# Patient Record
Sex: Female | Born: 1980 | Race: Black or African American | Hispanic: No | Marital: Single | Smoking: Former smoker
Health system: Southern US, Community
[De-identification: ages and names within clinical notes are randomized; demographics above are authoritative.]

## PROBLEM LIST (undated history)

## (undated) HISTORY — PX: CHOLECYSTECTOMY: SHX55

## (undated) HISTORY — PX: OTHER SURGICAL HISTORY: SHX169

---

## 2004-03-07 ENCOUNTER — Ambulatory Visit: Payer: Self-pay | Admitting: Unknown Physician Specialty

## 2004-07-13 ENCOUNTER — Inpatient Hospital Stay: Payer: Self-pay

## 2005-07-22 ENCOUNTER — Inpatient Hospital Stay: Payer: Self-pay | Admitting: Obstetrics and Gynecology

## 2006-10-15 ENCOUNTER — Emergency Department: Payer: Self-pay

## 2006-10-17 ENCOUNTER — Ambulatory Visit: Payer: Self-pay

## 2006-11-06 ENCOUNTER — Ambulatory Visit: Payer: Self-pay | Admitting: General Surgery

## 2007-11-26 ENCOUNTER — Emergency Department: Payer: Self-pay | Admitting: Emergency Medicine

## 2009-04-01 ENCOUNTER — Emergency Department: Payer: Self-pay | Admitting: Emergency Medicine

## 2009-06-17 ENCOUNTER — Emergency Department: Payer: Self-pay | Admitting: Emergency Medicine

## 2009-07-05 ENCOUNTER — Emergency Department: Payer: Self-pay | Admitting: Emergency Medicine

## 2009-07-08 ENCOUNTER — Emergency Department: Payer: Self-pay | Admitting: Emergency Medicine

## 2009-07-25 ENCOUNTER — Emergency Department: Payer: Self-pay | Admitting: Emergency Medicine

## 2009-12-10 ENCOUNTER — Emergency Department: Payer: Self-pay | Admitting: Emergency Medicine

## 2010-01-04 ENCOUNTER — Emergency Department: Payer: Self-pay | Admitting: Unknown Physician Specialty

## 2010-02-21 ENCOUNTER — Emergency Department: Payer: Self-pay | Admitting: Emergency Medicine

## 2010-06-10 ENCOUNTER — Emergency Department: Payer: Self-pay | Admitting: Unknown Physician Specialty
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
## 2013-01-04 ENCOUNTER — Emergency Department: Payer: Self-pay | Admitting: Psychiatry

## 2013-01-04 LAB — COMPREHENSIVE METABOLIC PANEL
Albumin: 3.8 g/dL (ref 3.4–5.0)
Alkaline Phosphatase: 95 U/L (ref 50–136)
Anion Gap: 5 — ABNORMAL LOW (ref 7–16)
Bilirubin,Total: 0.5 mg/dL (ref 0.2–1.0)
Chloride: 107 mmol/L (ref 98–107)
Co2: 25 mmol/L (ref 21–32)
Creatinine: 0.79 mg/dL (ref 0.60–1.30)
EGFR (African American): >60
Glucose: 113 mg/dL — ABNORMAL HIGH (ref 65–99)
SGPT (ALT): 22 U/L (ref 12–78)
Sodium: 137 mmol/L (ref 136–145)
Total Protein: 7.5 g/dL (ref 6.4–8.2)

## 2013-01-04 LAB — LIPASE, BLOOD: Lipase: 132 U/L (ref 73–393)

## 2013-01-04 LAB — CBC
HCT: 40.2 % (ref 35.0–47.0)
MCH: 29.2 pg (ref 26.0–34.0)
MCV: 84 fL (ref 80–100)
Platelet: 206 10*3/uL (ref 150–440)
RDW: 13.2 % (ref 11.5–14.5)

## 2013-01-04 LAB — URINALYSIS, COMPLETE
Bilirubin,UR: NEGATIVE
Blood: NEGATIVE
Ketone: NEGATIVE
Nitrite: NEGATIVE
Squamous Epithelial: 7
WBC UR: 15 /HPF (ref 0–5)

## 2013-01-06 LAB — URINE CULTURE

## 2013-11-27 ENCOUNTER — Ambulatory Visit: Payer: Self-pay | Admitting: Obstetrics and Gynecology

## 2013-11-27 LAB — CBC
HCT: 39 % (ref 35.0–47.0)
HGB: 12.7 g/dL (ref 12.0–16.0)
MCH: 28.1 pg (ref 26.0–34.0)
MCHC: 32.6 g/dL (ref 32.0–36.0)
MCV: 86 fL (ref 80–100)
Platelet: 196 10*3/uL (ref 150–440)
RBC: 4.52 10*6/uL (ref 3.80–5.20)
RDW: 13 % (ref 11.5–14.5)
WBC: 7.8 10*3/uL (ref 3.6–11.0)

## 2013-11-27 LAB — BASIC METABOLIC PANEL
Anion Gap: 5 — ABNORMAL LOW (ref 7–16)
BUN: 10 mg/dL (ref 7–18)
CALCIUM: 8.6 mg/dL (ref 8.5–10.1)
CHLORIDE: 107 mmol/L (ref 98–107)
CREATININE: 0.82 mg/dL (ref 0.60–1.30)
Co2: 25 mmol/L (ref 21–32)
EGFR (African American): >60
EGFR (Non-African Amer.): >60
GLUCOSE: 93 mg/dL (ref 65–99)
Osmolality: 273 (ref 275–301)
POTASSIUM: 4.4 mmol/L (ref 3.5–5.1)
Sodium: 137 mmol/L (ref 136–145)

## 2013-12-05 ENCOUNTER — Ambulatory Visit: Payer: Self-pay | Admitting: Obstetrics and Gynecology

## 2013-12-09 LAB — PATHOLOGY REPORT

## 2014-04-19 ENCOUNTER — Emergency Department: Payer: Self-pay | Admitting: Emergency Medicine

## 2014-04-22 LAB — BETA STREP CULTURE(ARMC)

## 2014-09-12 NOTE — Op Note (Signed)
PATIENT NAME:  Deborah RumpfHORNTON, Caresse R MR#:  [id number] DATE OF BIRTH:  27-Jun-1980  DATE OF PROCEDURE:  12/05/2013  PREOPERATIVE DIAGNOSIS: Multiparous female desiring permanent sterilization.   POSTOPERATIVE DIAGNOSIS:  Multiparous female desiring permanent sterilization.   PROCEDURE:  Bilateral salpingectomy.   SURGEON: Elliot Gurneyarrie C Kandise Riehle, M.D.   ESTIMATED BLOOD LOSS: Less than 50 mL.  FINDINGS: Bilateral tubes with fimbria. Normal ovaries bilaterally. (Dictation Anomaly) normal yet anemic appearing uterus.   PROCEDURE IN DETAIL:  The patient was taken to the operating room and placed in the supine position. After adequate general endotracheal anesthesia was instilled, the patient was prepped and draped in the usual sterile fashion. A side-opening speculum was placed in the patient's vagina and the anterior lip of the cervix was grasped with a single-tooth tenaculum.  was placed and the speculum was removed. The patient's bladder was emptied, with a red rubber which remained in place until the end of the procedure.   Attention was then turned to the abdomen where timeout was performed and the umbilicus was injected with Marcaine. An incision was made through the umbilicus. Veress needle was placed. Hanging drop test,  fluid instillation test, and fluid aspiration test showed proper placement of the Veress needle. The patient was placed in Trendelenburg. CO2 was placed on low flow. When tympany was heard around the liver, CO2 was placed on high flow. The Veress needle was removed and the Xcel trocar was placed under direct visualization with the camera. A 5 mm trocar port was placed on the left lower edge of the left lower quadrant and the 11 mm trocar port was placed in the midline above the pubic symphysis.   The uterus and tubes were doused with Marcaine.  The right tube was grasped and the Harmonic scalpel was used to cut across the uterotubal broad ligament, then used to cut across the tube  cauterizing this at the point of detachment of the right tube. In a similar fashion, the left tube was grasped.  The broad ligament was cut through with the Harmonic scalpel. The Harmonic scalpel was then used to cauterize and cut across the tubes approximately 2 cm past the cornua. Good hemostasis was identified. There was one area that appeared to be a duplication of the tube on the patient's right side and this area was also excised and sent to pathology. The pelvis was copiously irrigated with warm normal saline. Trocars were removed. The patient was taken out of Trendelenburg. CO2 was allowed to escape from the abdomen. The Hulka tenaculum was removed from the cervix. The red rubber was removed.  Monocryl 4-0 was used to approximate the skin edges. Dermabond was placed, and 2 x 2's and Tegaderms to cover the incision. The patient was laid supine and taken to recovery after having tolerated the procedure well.     ____________________________ Elliot Gurneyarrie C. Jan Olano, MD cck:ds D: 12/10/2013 17:57:01 ET T: 12/10/2013 20:02:59 ET JOB#: [id number]  cc: Elliot Gurneyarrie C. Ellen Mayol, MD, <Dictator> Elliot GurneyARRIE C Deavin Forst MD ELECTRONICALLY SIGNED 12/11/2013 9:16

## 2018-03-11 ENCOUNTER — Encounter: Payer: Self-pay | Admitting: Emergency Medicine

## 2018-03-11 ENCOUNTER — Other Ambulatory Visit: Payer: Self-pay

## 2018-03-11 ENCOUNTER — Ambulatory Visit
Admission: EM | Admit: 2018-03-11 | Discharge: 2018-03-11 | Disposition: A | Discharge status: Home / Self Care | Payer: Self-pay | Attending: Internal Medicine | Admitting: Internal Medicine

## 2018-03-11 DIAGNOSIS — A491 Streptococcal infection, unspecified site: Secondary | ICD-10-CM | POA: Insufficient documentation

## 2018-03-11 DIAGNOSIS — Z20818 Contact with and (suspected) exposure to other bacterial communicable diseases: Secondary | ICD-10-CM | POA: Insufficient documentation

## 2018-03-11 DIAGNOSIS — J029 Acute pharyngitis, unspecified: Secondary | ICD-10-CM | POA: Insufficient documentation

## 2018-03-11 DIAGNOSIS — Z792 Long term (current) use of antibiotics: Secondary | ICD-10-CM | POA: Insufficient documentation

## 2018-03-11 DIAGNOSIS — Z9049 Acquired absence of other specified parts of digestive tract: Secondary | ICD-10-CM | POA: Insufficient documentation

## 2018-03-11 DIAGNOSIS — Z9889 Other specified postprocedural states: Secondary | ICD-10-CM | POA: Insufficient documentation

## 2018-03-11 DIAGNOSIS — F17210 Nicotine dependence, cigarettes, uncomplicated: Secondary | ICD-10-CM | POA: Insufficient documentation

## 2018-03-11 LAB — RAPID STREP SCREEN (MED CTR MEBANE ONLY): STREPTOCOCCUS, GROUP A SCREEN (DIRECT): NEGATIVE

## 2018-03-11 MED ORDER — PENICILLIN V POTASSIUM 500 MG PO TABS: ORAL_TABLET | ORAL | 0 refills | Status: AC

## 2018-03-11 NOTE — ED Provider Notes (Addendum)
[facility]    CSN: [id number] Arrival date & time: 03/11/18  1204     History   Chief Complaint Chief Complaint  Patient presents with  . Generalized Body Aches  . Sore Throat    HPI Deborah Valdez is a 37 y.o. female.   Onset of body aches, ST, swollen glands, headache and L ear pain since this am. Was fine when she went to bed. Tried to go to work this am and had to leave. Had been having sweating today and last night. Has not taken any meds so far. Her daughter was diagnosed with strep test last week.      History reviewed. No pertinent past medical history.  There are no active problems to display for this patient.   Past Surgical History:  Procedure Laterality Date  . CHOLECYSTECTOMY    . fallopian tubes removed      OB History   None      Home Medications    Prior to Admission medications   Medication Sig Start Date End Date Taking? Authorizing Provider  penicillin v potassium (VEETID) 500 MG tablet One bid x 10 days 03/11/18   Rodriguez-Southworth, Nettie Elm, PA-C    Family History Family History  Problem Relation Age of Onset  . Healthy Mother   . Healthy Father     Social History Social History   Tobacco Use  . Smoking status: Current Every Day Smoker    Packs/day: 0.50    Years: 12.00    Pack years: 6.00    Types: Cigarettes  . Smokeless tobacco: Never Used  Substance Use Topics  . Alcohol use: Yes    Comment: socially  . Drug use: Never     Allergies   Patient has no known allergies.   Review of Systems Review of Systems  Constitutional: Positive for appetite change, chills, fatigue and fever.  HENT: Positive for congestion, postnasal drip, rhinorrhea and sore throat. Negative for ear discharge, ear pain, mouth sores, trouble swallowing and voice change.   Eyes: Negative for discharge.  Respiratory: Positive for cough. Negative for chest tightness and shortness of breath.   Gastrointestinal: Negative for  nausea and vomiting.  Musculoskeletal: Negative for gait problem.  Skin: Negative for rash.  Neurological: Positive for headaches. Negative for dizziness.  Hematological: Positive for adenopathy.   Physical Exam Triage Vital Signs ED Triage Vitals  Enc Vitals Group     BP 03/11/18 1221 110/70     Pulse Rate 03/11/18 1221 72     Resp 03/11/18 1221 16     Temp 03/11/18 1221 98.2 F (36.8 C)     Temp Source 03/11/18 1221 Oral     SpO2 03/11/18 1221 99 %     Weight 03/11/18 1220 246 lb (111.6 kg)     Height 03/11/18 1220 5\' 8"  (1.727 m)     Head Circumference --      Peak Flow --      Pain Score 03/11/18 1220 7     Pain Loc --      Pain Edu? --      Excl. in GC? --    No data found.  Updated Vital Signs BP 110/70 (BP Location: Left Arm)   Pulse 72   Temp 98.2 F (36.8 C) (Oral)   Resp 16   Ht 5\' 8"  (1.727 m)   Wt 246 lb (111.6 kg)   LMP 02/23/2018 (Exact Date)   SpO2 99%   BMI 37.40 kg/m  Visual Acuity Right Eye Distance:   Left Eye Distance:   Bilateral Distance:    Right Eye Near:   Left Eye Near:    Bilateral Near:     Physical Exam  Constitutional: She is oriented to person, place, and time. She appears well-nourished.  Non-toxic appearance. She does not appear ill. No distress.  HENT:  Right Ear: Hearing, tympanic membrane and ear canal normal. No drainage, swelling or tenderness.  Left Ear: Hearing, tympanic membrane and ear canal normal. No drainage, swelling or tenderness.  Mouth/Throat: Mucous membranes are normal. No oral lesions. No uvula swelling. No oropharyngeal exudate, posterior oropharyngeal edema, posterior oropharyngeal erythema or tonsillar abscesses.  Pharynx slightly erythematous  Eyes: EOM are normal.  Neck: Neck supple.  Upper anterior cervical  Cardiovascular: Normal rate and normal heart sounds.  No murmur heard. Pulmonary/Chest: Effort normal and breath sounds normal.  Lymphadenopathy:    She has cervical adenopathy.    Neurological: She is alert and oriented to person, place, and time.  Skin: Skin is warm and dry. No rash noted.  Psychiatric: She has a normal mood and affect. Her behavior is normal.  Vitals reviewed.    UC Treatments / Results  Labs (all labs ordered are listed, but only abnormal results are displayed) Labs Reviewed  RAPID STREP SCREEN ([facility] ONLY)  CULTURE, GROUP A STREP [facility])    EKG None  Radiology No results found.  Procedures none  Medications Ordered in UC Medications - No data to display  Initial Impression / Assessment and Plan / UC Course  I have reviewed the triage vital signs and the nursing notes.  Pertinent labs  Results that were available during my care of the patient were reviewed by me and considered in my medical decision making (see chart for details). Due to being exposed to strep, I went ahead and stared her on [facility] as noted and we will call her with results when done.  She is to watch out for influenza symptoms, specially if culture is neg.   Final Clinical Impressions(s) / UC Diagnoses   Final diagnoses:  Pharyngitis, unspecified etiology  Strep exposure   Discharge Instructions     Watch out for fever, worse cold symptoms and worse body aches. It could be influenza.     ED Prescriptions    Medication Sig Dispense Auth. Provider   penicillin v potassium (VEETID) 500 MG tablet One bid x 10 days 20 tablet Rodriguez-Southworth, Nettie Elm, PA-C     Controlled Substance Prescriptions [place] Controlled Substance Registry consulted?    Garey Ham, PA-C 03/13/18 0728    Rodriguez-Southworth, Nettie Elm, PA-C 03/13/18 [phone]

## 2018-03-11 NOTE — Discharge Instructions (Signed)
Watch out for fever, worse cold symptoms and worse body aches. It could be influenza.

## 2018-03-11 NOTE — ED Triage Notes (Signed)
Patient in today c/o body aches, sore throat, headache and left ear pain since this morning. Patient states she brought her daughter in ~1.5 weeks ago and was diagnosed with strep throat. Patient has had chills, but hasn't taken her temperature. Patient has not tried any OTC medications.

## 2018-03-13 ENCOUNTER — Telehealth: Payer: Self-pay | Admitting: Emergency Medicine

## 2018-03-13 LAB — CULTURE, GROUP A STREP (THRC)

## 2018-03-13 NOTE — Telephone Encounter (Signed)
Patient positive strep A, treated at visit.  Called patient no answer, left message.

## 2018-03-14 ENCOUNTER — Telehealth: Payer: Self-pay | Admitting: Emergency Medicine

## 2018-03-14 NOTE — Telephone Encounter (Signed)
Patient returned call, results communicated and patient verbalized understanding will continue with plan of care.

## 2019-07-07 ENCOUNTER — Other Ambulatory Visit: Payer: Self-pay

## 2019-07-07 ENCOUNTER — Ambulatory Visit (LOCAL_COMMUNITY_HEALTH_CENTER): Payer: Medicaid Other

## 2019-07-07 DIAGNOSIS — Z111 Encounter for screening for respiratory tuberculosis: Secondary | ICD-10-CM

## 2019-07-09 ENCOUNTER — Ambulatory Visit (LOCAL_COMMUNITY_HEALTH_CENTER): Payer: Medicaid Other

## 2019-07-09 ENCOUNTER — Other Ambulatory Visit: Payer: Self-pay

## 2019-07-09 DIAGNOSIS — Z111 Encounter for screening for respiratory tuberculosis: Secondary | ICD-10-CM

## 2019-07-09 LAB — TB SKIN TEST
Induration: 0 mm
TB Skin Test: NEGATIVE

## 2019-07-09 NOTE — Progress Notes (Signed)
PPD read at 48 hrs d/t impending weather. Richmond Campbell, RN

## 2019-07-10 ENCOUNTER — Other Ambulatory Visit: Payer: Medicaid Other

## 2020-07-01 ENCOUNTER — Other Ambulatory Visit: Payer: Self-pay

## 2020-07-01 ENCOUNTER — Encounter: Payer: Self-pay | Admitting: Emergency Medicine

## 2020-07-01 ENCOUNTER — Ambulatory Visit
Admission: EM | Admit: 2020-07-01 | Discharge: 2020-07-01 | Disposition: A | Discharge status: Home / Self Care | Payer: Medicaid Other | Attending: Sports Medicine | Admitting: Sports Medicine

## 2020-07-01 DIAGNOSIS — I889 Nonspecific lymphadenitis, unspecified: Secondary | ICD-10-CM | POA: Insufficient documentation

## 2020-07-01 DIAGNOSIS — H9202 Otalgia, left ear: Secondary | ICD-10-CM | POA: Insufficient documentation

## 2020-07-01 DIAGNOSIS — J029 Acute pharyngitis, unspecified: Secondary | ICD-10-CM | POA: Insufficient documentation

## 2020-07-01 LAB — GROUP A STREP BY PCR: Group A Strep by PCR: NOT DETECTED

## 2020-07-01 NOTE — ED Triage Notes (Signed)
Pt c/o sore throat and left ear pain. Started yesterday. Denies there other cold symptoms or fever. Declines covid testing.

## 2020-07-01 NOTE — ED Provider Notes (Signed)
[facility]    CSN: [id number] Arrival date & time: 07/01/20  1208      History   Chief Complaint Chief Complaint  Patient presents with  . Otalgia  . Sore Throat    HPI Deborah Valdez is a 40 y.o. female.   Patient pleasant 40 year old female who presents for evaluation of 2 days of a sore throat and some mild soreness in her left ear.  She works over at [employer].  She denies any strep exposure.  No cough congestion chest pain shortness of breath or rhinorrhea.  No nausea vomiting diarrhea.  No fever shakes chills.  No abdominal pain or urinary symptoms.  She has been vaccinated against COVID and has received the booster.  No Covid exposure.  She has not received her flu shot.  Comes in today for initial evaluation mostly for her throat.  No red flag signs or symptoms elicited on history.     History reviewed. No pertinent past medical history.  There are no problems to display for this patient.   Past Surgical History:  Procedure Laterality Date  . CHOLECYSTECTOMY    . fallopian tubes removed      OB History   No obstetric history on file.      Home Medications    Prior to Admission medications   Medication Sig Start Date End Date Taking? Authorizing Provider  penicillin v potassium (VEETID) 500 MG tablet One bid x 10 days 03/11/18   Rodriguez-Southworth, Nettie Elm, PA-C    Family History Family History  Problem Relation Age of Onset  . Healthy Mother   . Healthy Father     Social History Social History   Tobacco Use  . Smoking status: Former Smoker    Packs/day: 0.50    Years: 12.00    Pack years: 6.00    Types: Cigarettes    Quit date: 07/2019    Years since quitting: 0.9  . Smokeless tobacco: Never Used  Vaping Use  . Vaping Use: Never used  Substance Use Topics  . Alcohol use: Yes    Comment: socially  . Drug use: Never     Allergies   Patient has no known allergies.   Review of Systems Review of Systems   Constitutional: Negative for chills, diaphoresis, fatigue and fever.  HENT: Positive for ear pain and sore throat. Negative for congestion, ear discharge, postnasal drip, rhinorrhea, sinus pressure, sinus pain and sneezing.   Eyes: Negative for pain.  Respiratory: Negative for cough, chest tightness, shortness of breath, wheezing and stridor.   Cardiovascular: Negative for chest pain and palpitations.  Gastrointestinal: Negative for abdominal pain, constipation, diarrhea, nausea and vomiting.  Genitourinary: Negative for dysuria.  Musculoskeletal: Negative for arthralgias and myalgias.  Skin: Negative for color change, pallor, rash and wound.  Neurological: Negative for dizziness, light-headedness and headaches.     Physical Exam Triage Vital Signs ED Triage Vitals  Enc Vitals Group     BP 07/01/20 1222 120/79     Pulse Rate 07/01/20 1222 (!) 56     Resp 07/01/20 1222 18     Temp 07/01/20 1222 98.9 F (37.2 C)     Temp Source 07/01/20 1222 Oral     SpO2 07/01/20 1222 100 %     Weight 07/01/20 1220 246 lb 0.5 oz (111.6 kg)     Height 07/01/20 1220 5\' 8"  (1.727 m)     Head Circumference --      Peak Flow --  Pain Score 07/01/20 1219 5     Pain Loc --      Pain Edu? --      Excl. in GC? --    No data found.  Updated Vital Signs BP 120/79 (BP Location: Left Arm)   Pulse (!) 56   Temp 98.9 F (37.2 C) (Oral)   Resp 18   Ht 5\' 8"  (1.727 m)   Wt 111.6 kg   LMP 06/24/2020   SpO2 100%   BMI 37.41 kg/m   Visual Acuity Right Eye Distance:   Left Eye Distance:   Bilateral Distance:    Right Eye Near:   Left Eye Near:    Bilateral Near:     Physical Exam Vitals and nursing note reviewed.  Constitutional:      General: She is not in acute distress.    Appearance: She is well-developed. She is not ill-appearing or toxic-appearing.  HENT:     Head: Normocephalic and atraumatic.     Right Ear: Tympanic membrane normal.     Left Ear: Tympanic membrane normal.      Nose: No congestion or rhinorrhea.     Mouth/Throat:     Mouth: Mucous membranes are moist. No oral lesions.     Pharynx: Uvula midline. Posterior oropharyngeal erythema present. No pharyngeal swelling, oropharyngeal exudate or uvula swelling.     Tonsils: No tonsillar exudate or tonsillar abscesses. 1+ on the right. 1+ on the left.  Eyes:     Conjunctiva/sclera: Conjunctivae normal.     Pupils: Pupils are equal, round, and reactive to light.  Cardiovascular:     Rate and Rhythm: Normal rate and regular rhythm.     Heart sounds: Normal heart sounds. No murmur heard. No friction rub. No gallop.      Comments: Recheck of heart rate was 64 bpm. Pulmonary:     Effort: Pulmonary effort is normal. No respiratory distress.     Breath sounds: Normal breath sounds. No stridor. No wheezing, rhonchi or rales.  Musculoskeletal:     Cervical back: Normal range of motion and neck supple.  Lymphadenopathy:     Cervical: Cervical adenopathy present.  Skin:    General: Skin is warm and dry.     Capillary Refill: Capillary refill takes less than 2 seconds.  Neurological:     General: No focal deficit present.     Mental Status: She is alert and oriented to person, place, and time.  Psychiatric:        Mood and Affect: Mood normal.        Behavior: Behavior normal.      UC Treatments / Results  Labs (all labs ordered are listed, but only abnormal results are displayed) Labs Reviewed  GROUP A STREP BY PCR    EKG   Radiology No results found.  Procedures Procedures (including critical care time)  Medications Ordered in UC Medications - No data to display  Initial Impression / Assessment and Plan / UC Course  I have reviewed the triage vital signs and the nursing notes.  Pertinent labs & imaging results that were available during my care of the patient were reviewed by me and considered in my medical decision making (see chart for details).  Clinical impression:  2 days of a sore  throat with mild ear pain.  Some mild erythema in the oropharynx, ears are within normal limits.  Treatment plan: 1.  The findings and treatment plan were discussed in detail with the patient.  Patient was in agreement. 2.  Strep test was negative.  No antibiotics were needed. 3.  Educational handout provided. 4.  Over-the-counter meds as needed, Tylenol or Motrin for fever discomfort.  Throat lozenges for the sore throat.  She can also use over-the-counter ear medication as needed.  No ear infection was appreciated on exam. 5.  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 6.  Patient declined on Covid testing and given her minimal symptoms and normal vital signs with reassuring physical exam that was fine. 7.  Follow-up here as needed.    Final Clinical Impressions(s) / UC Diagnoses   Final diagnoses:  Sore throat  Ear pain, left  Cervical lymphadenitis     Discharge Instructions     Your strep test was negative.  No antibiotics are indicated. Please see educational handouts. Over-the-counter meds as needed, Tylenol or Motrin for fever discomfort.  Throat lozenges for your sore throat.  There are also over-the-counter ear remedies if the pressure in your ear becomes unbearable you can always purchase that.  You do not have an ear infection. I provided you with a work note saying you were seen today and can return to work tomorrow.  I hope you get to feeling better, Dr. Zachery Dauer    ED Prescriptions    None     PDMP not reviewed this encounter.   Delton See, MD 07/01/20 2017

## 2020-07-01 NOTE — Discharge Instructions (Addendum)
Your strep test was negative.  No antibiotics are indicated. Please see educational handouts. Over-the-counter meds as needed, Tylenol or Motrin for fever discomfort.  Throat lozenges for your sore throat.  There are also over-the-counter ear remedies if the pressure in your ear becomes unbearable you can always purchase that.  You do not have an ear infection. I provided you with a work note saying you were seen today and can return to work tomorrow.  I hope you get to feeling better, Dr. Zachery Dauer
